# Patient Record
Sex: Female | Born: 1960
Health system: Southern US, Community
[De-identification: ages and names within clinical notes are randomized; demographics above are authoritative.]

## PROBLEM LIST (undated history)

## (undated) DIAGNOSIS — L709 Acne, unspecified: Secondary | ICD-10-CM

## (undated) DIAGNOSIS — K449 Diaphragmatic hernia without obstruction or gangrene: Secondary | ICD-10-CM

## (undated) DIAGNOSIS — D649 Anemia, unspecified: Secondary | ICD-10-CM

## (undated) DIAGNOSIS — Z9289 Personal history of other medical treatment: Secondary | ICD-10-CM

## (undated) HISTORY — DX: Anemia, unspecified: D64.9

## (undated) HISTORY — PX: APPENDECTOMY: SHX54

## (undated) HISTORY — PX: OTHER SURGICAL HISTORY: SHX169

## (undated) HISTORY — DX: Personal history of other medical treatment: Z92.89

## (undated) HISTORY — DX: Diaphragmatic hernia without obstruction or gangrene: K44.9

## (undated) HISTORY — DX: Acne, unspecified: L70.9

---
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
## 1998-03-27 ENCOUNTER — Other Ambulatory Visit: Admission: RE | Admit: 1998-03-27 | Discharge: 1998-03-27 | Payer: Self-pay | Admitting: Obstetrics and Gynecology

## 1999-12-07 ENCOUNTER — Other Ambulatory Visit: Admission: RE | Admit: 1999-12-07 | Discharge: 1999-12-07 | Payer: Self-pay | Admitting: Obstetrics and Gynecology

## 2000-08-16 ENCOUNTER — Encounter: Payer: Self-pay | Admitting: Obstetrics and Gynecology

## 2000-08-16 ENCOUNTER — Ambulatory Visit (HOSPITAL_COMMUNITY): Admission: RE | Admit: 2000-08-16 | Discharge: 2000-08-16 | Payer: Self-pay | Admitting: Obstetrics and Gynecology

## 2001-07-16 ENCOUNTER — Other Ambulatory Visit: Admission: RE | Admit: 2001-07-16 | Discharge: 2001-07-16 | Payer: Self-pay | Admitting: Obstetrics and Gynecology

## 2002-09-24 ENCOUNTER — Other Ambulatory Visit: Admission: RE | Admit: 2002-09-24 | Discharge: 2002-09-24 | Payer: Self-pay | Admitting: Obstetrics and Gynecology

## 2004-05-18 ENCOUNTER — Other Ambulatory Visit: Admission: RE | Admit: 2004-05-18 | Discharge: 2004-05-18 | Payer: Self-pay | Admitting: Obstetrics and Gynecology

## 2005-10-26 ENCOUNTER — Other Ambulatory Visit: Admission: RE | Admit: 2005-10-26 | Discharge: 2005-10-26 | Payer: Self-pay | Admitting: Obstetrics and Gynecology

## 2007-06-14 ENCOUNTER — Other Ambulatory Visit: Admission: RE | Admit: 2007-06-14 | Discharge: 2007-06-14 | Payer: Self-pay | Admitting: Obstetrics & Gynecology

## 2007-08-21 ENCOUNTER — Ambulatory Visit (HOSPITAL_BASED_OUTPATIENT_CLINIC_OR_DEPARTMENT_OTHER): Admission: RE | Admit: 2007-08-21 | Discharge: 2007-08-21 | Payer: Self-pay | Admitting: Obstetrics & Gynecology

## 2009-01-02 ENCOUNTER — Encounter: Admission: RE | Admit: 2009-01-02 | Discharge: 2009-01-02 | Payer: Self-pay | Admitting: Family Medicine

## 2009-02-17 ENCOUNTER — Other Ambulatory Visit: Admission: RE | Admit: 2009-02-17 | Discharge: 2009-02-17 | Payer: Self-pay | Admitting: Obstetrics & Gynecology

## 2010-02-21 IMAGING — RF DG ESOPHAGUS
13 of 15 series · 19 of 24 positions shown · non-contrast
Comparison: None

CLINICAL DATA: Dysphagia.

ESOPHOGRAM/BARIUM SWALLOW
TECHNIQUE: Combined double contrast and single contrast
examination performed using effervescent crystals, thick barium
liquid, and thin barium liquid. 13 mm barium tablet ingested with
water.
Fluoroscopy time:  1.7 minutes.

[Series 1: run · 1 of 3 slices shown (1 of 13)]
[im 1/3]
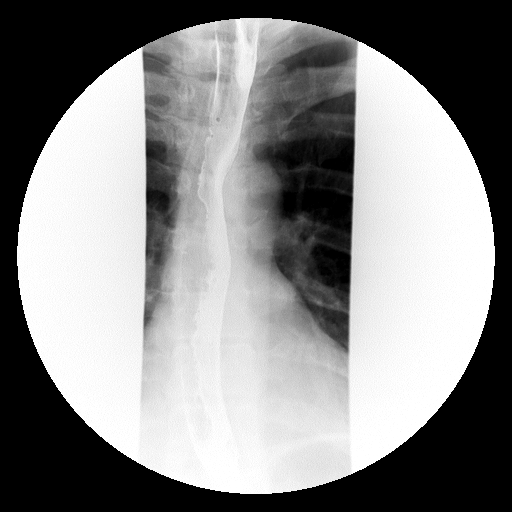

[Series 2: run · 1 of 3 slices shown (2 of 13)]
[im 1/3]
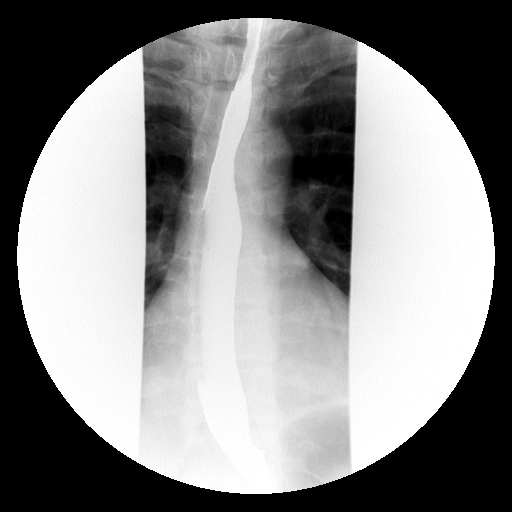

[Series 4: run · 1 of 1 slices shown (3 of 13)]
[im 1/1]
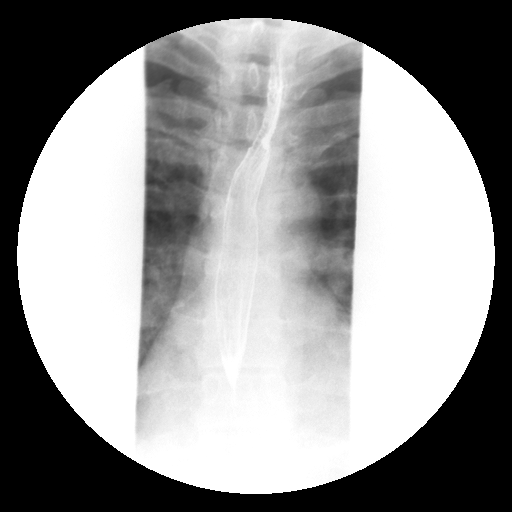

[Series 5: run · 1 of 1 slices shown (4 of 13)]
[im 1/1]
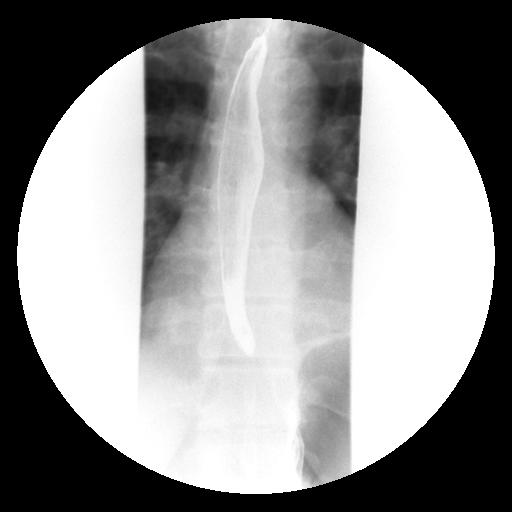

[Series 6: run · 1 of 1 slices shown (5 of 13)]
[im 1/1]
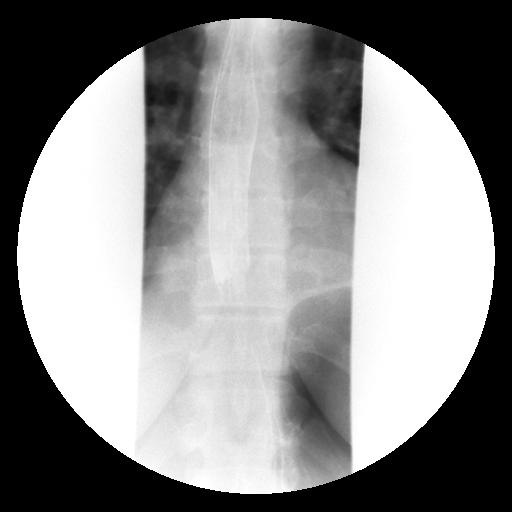

[Series 7: run · 1 of 7 slices shown (6 of 13)]
[im 1/7]
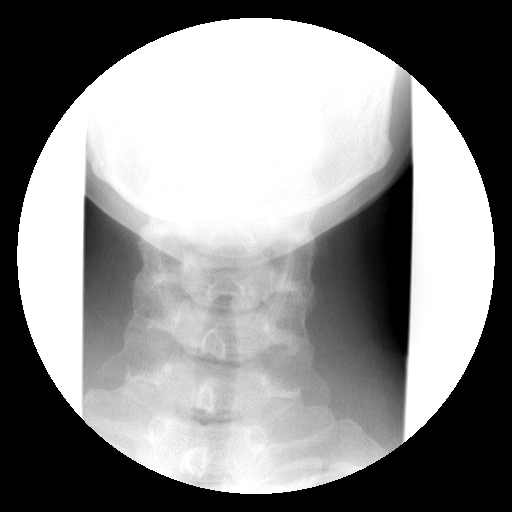

[Series 9: run · 1 of 5 slices shown (7 of 13)]
[im 1/5]
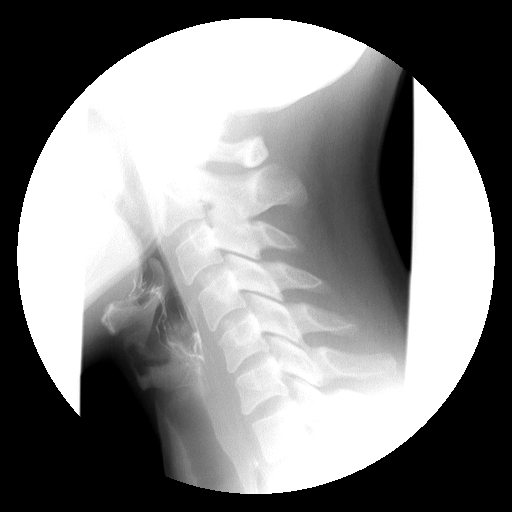

[Series 10: run · 1 of 8 slices shown (8 of 13)]
[im 1/8]
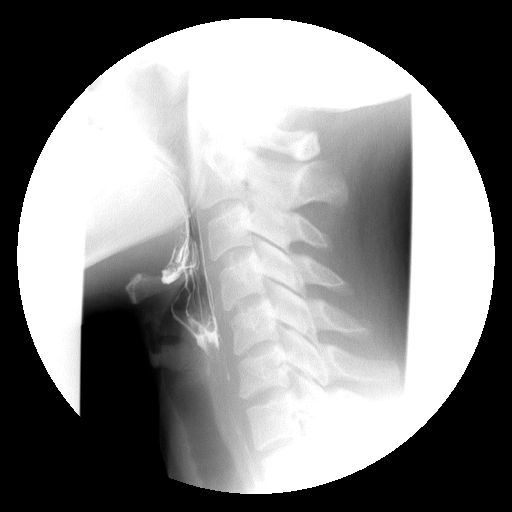

[Series 11: run · 1 of 1 slices shown (9 of 13)]
[im 1/1]
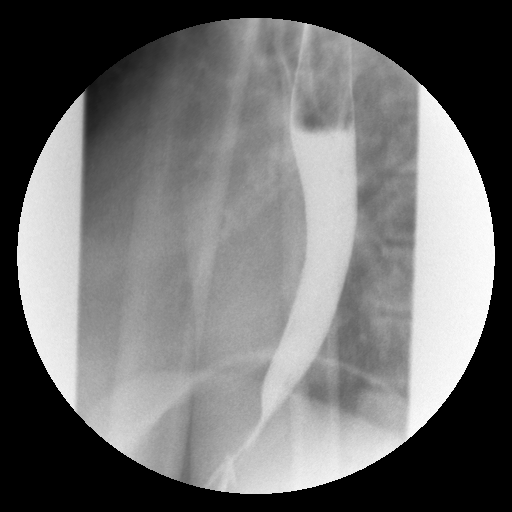

[Series 12: run · 2 of 13 slices shown (10 of 13)]
[im 7/13]
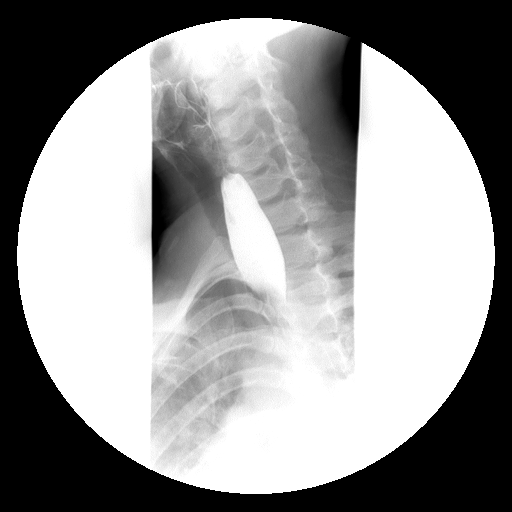
[im 13/13]
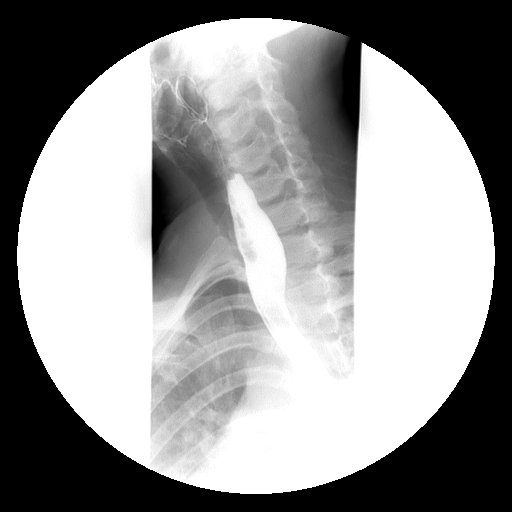

[Series 13: run · 1 of 6 slices shown (11 of 13)]
[im 1/6]
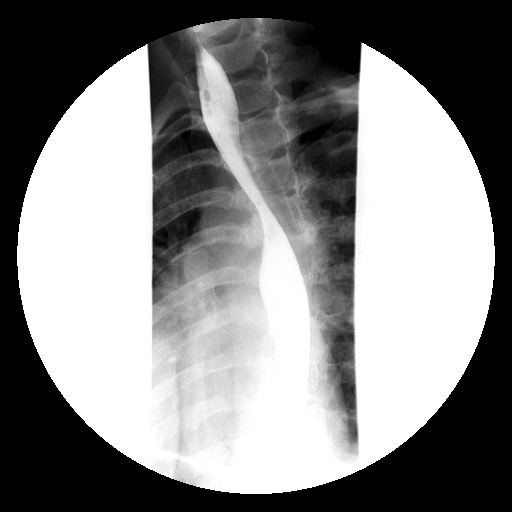

[Series 14: run · 6 of 25 slices shown (12 of 13)]
[im 1/25]
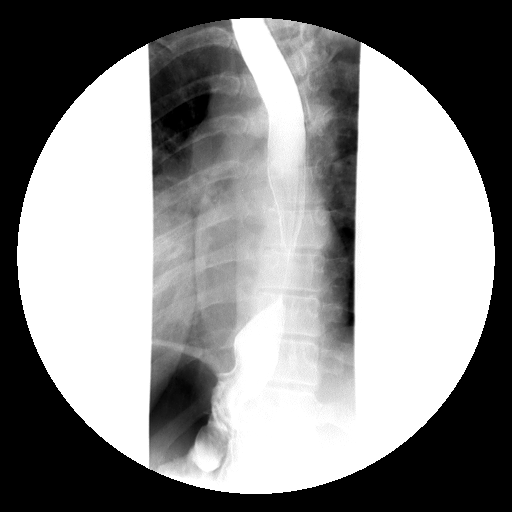
[im 7/25]
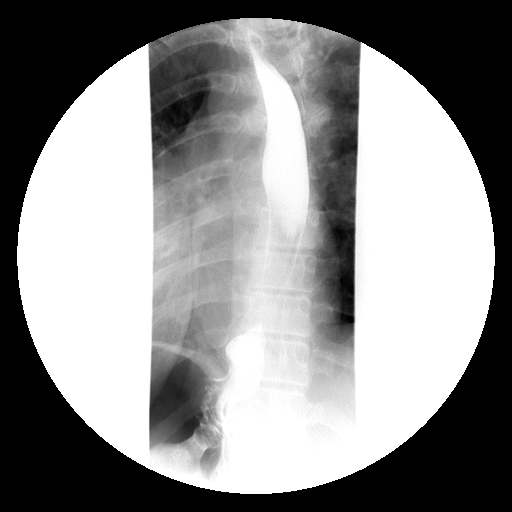
[im 11/25]
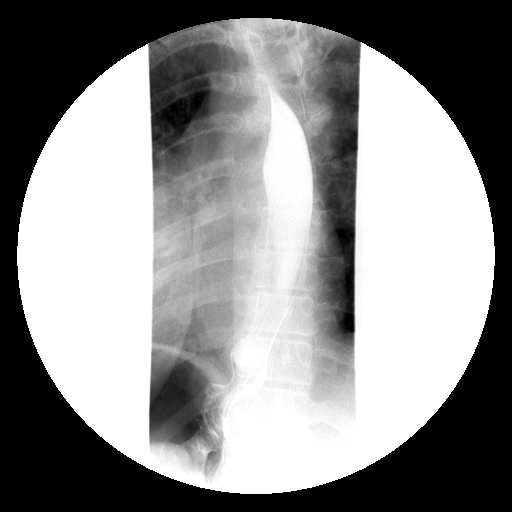
[im 14/25]
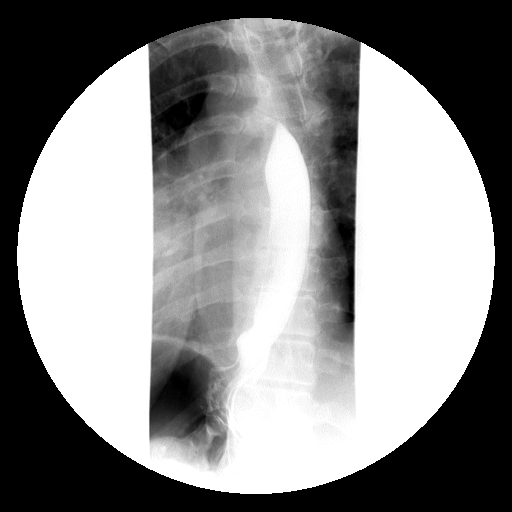
[im 18/25]
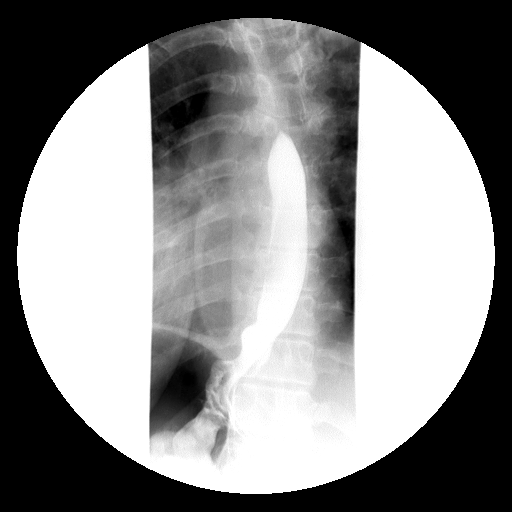
[im 25/25]
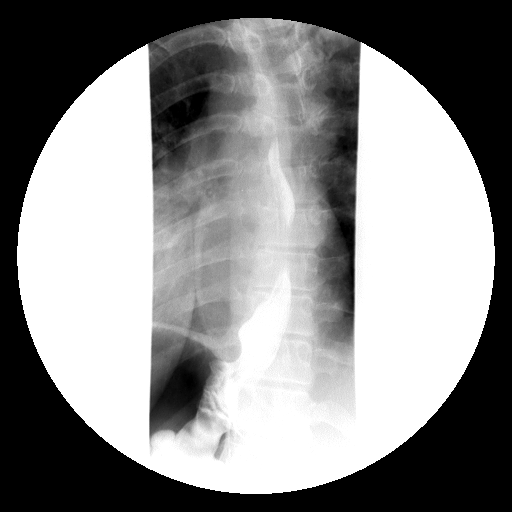

[Series 15: run · 1 of 1 slices shown (13 of 13)]
[im 1/1]
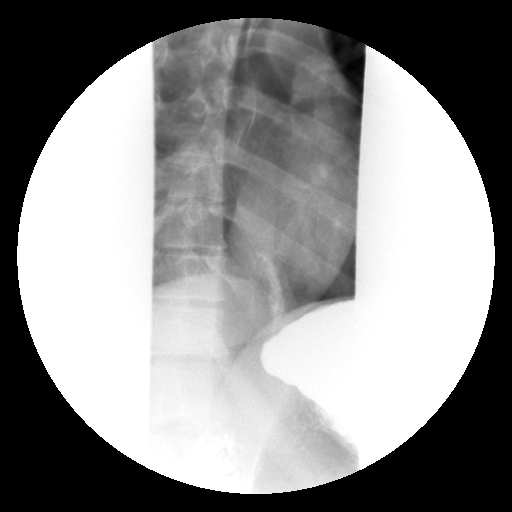

[19 of 24 positions shown; findings below may reference images not displayed]

FINDINGS: Normal antegrade peristalsis is seen through the
cervical thoracic esophagus with very small sliding type hiatus
hernia.  Ingested 13 mm barium tablet readily passed into the
stomach.  No spontaneous with slight induced (water
siphon/Valsalva) gastroesophageal reflux was demonstrated.  No
other intrinsic or extrinsic esophageal lesions seen.
IMPRESSION: 1.  Very small sliding type hiatus hernia with slight single
induced gastroesophageal reflux demonstrated.
2.  Otherwise, normal.

## 2010-04-13 ENCOUNTER — Encounter: Admission: RE | Admit: 2010-04-13 | Discharge: 2010-04-13 | Payer: Self-pay | Admitting: Obstetrics & Gynecology

## 2011-03-15 NOTE — Op Note (Signed)
NAMESYANN, Stephanie Callahan            ACCOUNT NO.:  [id number]   MEDICAL RECORD NO.:  [id number]          PATIENT TYPE:  AMB   LOCATION:  [facility]                         FACILITY:  [facility]   PHYSICIAN:  M. Leda Quail, MD  DATE OF BIRTH:  26-Nov-1960   DATE OF PROCEDURE:  08/21/2007  DATE OF DISCHARGE:                               OPERATIVE REPORT   PREOPERATIVE DIAGNOSIS:  76. 50 year old G3, P1 married white female with menorrhagia.  2. Ultrasound showed possible menorrhagia.  3. Endometrial biopsy showed benign proliferative endometrium.  4. Undesired fertility.  5. Anemia.   POSTOPERATIVE DIAGNOSIS:  40. 50 year old G58, P1 married white female with menorrhagia.  2. Ultrasound showed possible menorrhagia.  3. Endometrial biopsy showed benign proliferative endometrium.  4. Undesired fertility.  5. Anemia.   PROCEDURE:  Essure tubal occlusion and hydrothermal ablation.   SURGEON:  M. Leda Quail, M.D.   ASSISTANT:  OR staff.   ANESTHESIA:  MAC.   FINDINGS:  Thin endometrium.  Normal appearing tubal ostia bilaterally.  Photo documentation was made and is in the chart.   SPECIMENS:  None.   ESTIMATED BLOOD LOSS:  Minimal.   FLUIDS:  1000 mL of LR.   URINE OUTPUT:  40 mL.   DRAIN:  In and out catheterization at the end of the procedure.   FLUID DEFICIT:  Normal saline was used for this procedure and deficit  was minimal.   COMPLICATIONS:  None.   INDICATIONS:  Ms. Kimbler is a very nice 50 year old G3, P1 married white  female who has a history of menorrhagia.  This has worsened over the  last year and she has decided to proceed with some treatment options.  She underwent an ultrasound which showed an area of possible adenomyosis  in the uterus but, otherwise, normal findings.  She has not used birth  control in over ten years and is interested in a tubal occlusion  procedure.  She does not want a skin incision if at all possible.  Because of this, we discussed  Essure tubal occlusion.  If I was unable  to get these in the fallopian tubes, we decided not to proceed with a  laparoscopic tubal ligation.  She is comfortable with this.  She does  note that in also deciding to proceed with an ablation for treatment of  the bleeding that she should not get pregnant in the future.  She was  counseled about other options including oral contraceptives, Jearld Adjutant IUD,  and more definitive therapy with surgical excision of the uterus and she  has decided with this procedure at this time.  The risks and benefits  have been explained and are documented in the chart.  She was pretreated  with Lupron and did have a presurgical ultrasound which showed a thin  endometrium measuring approximately 3 mm.   PROCEDURE IN DETAIL:  The patient was taken to the operating room.  She  was placed in the supine position.  General anesthesia is administered  by the anesthesia staff without difficulty.  Her legs were positioned in  [place] stirrups.  Care was taken to ensure there  is no pressure on the  calves or knees.  She does have SCDs on bilaterally.  The legs were then  positioned in the dorsal lithotomy position.  The perineum, inner thighs  and vagina were prepped in a normal sterile fashion.  A red rubber Foley  catheter was used is drain the bladder of all urine.  The patient is  then draped in a normal sterile fashion.   Attention is turned the vagina.  A bivalve speculum was placed in the  vagina.  The anterior lip of the cervix was grasped with a single tooth  tenaculum.  A paracervical block is placed with 2.5 mL of 1% lidocaine  at the 3, 6, 9, and 12 o'clock positions around the cervix.  The uterus  was sounded to 10 cm.  Using [facility] dilators, the cervix is dilated to a  #19.  A 17 French hysteroscope was used for this portion of the  procedure.  This was passed through the cervical os to the fundus of the  uterus.  Tubal ostia are noted bilaterally and endometrial  tissue  appears thin.  The introducer was placed through the working channel of  the hysteroscope.  The first Essure device is passed through the working  channel and visualized at the tip of the hysteroscope.  Starting with  the left fallopian tube, the device was passed in the fallopian tube  without difficulty.  This was passed until the green portion of the  device is noted.  The wheel on the device was then rotated backwards to  a hard stop.  The gold notch and green band are noted just outside the  fallopian tube.  The button is depressed deploying the device and the  wheel is rotated backwards, again, to remove all the coil from the tube.  There were 2 1/2 coils that were outside the fallopian tube at this  point.  The first device was completely removed from the hysteroscope  and a second was placed through the working channel.  In a similar  fashion, the device is passed through the fallopian tube until the green  band was noted.  Then, the wheel was rotated backwards until a hard stop  was noted.  Then, the coil was positioned in the fallopian tube so that  the green band and the gold notch were noted.  The button was depressed  and then the device rotated backwards to free the coil from the device.  Approximately 1 loop of the coil was noted outside the tube at this  point. Photo documentation was made.   At this point, the hysteroscope was removed.  The anterior lip of the  tenaculum on the anterior lip was left in place.  The cervix was dilated  up to #21 using Pratt dilators.  Then, the hysteroscope with the [facility]  apparatus attachment was advanced through the cervical os and to the  fundus.  The device was positioned closer to the fundus because of the  length of the uterus.  At this point, when appropriate placement was  felt to be present, the heating cycle was performed.  At the end of the  heating cycle, the reservoir was at 80 87 mmHg.  There was no leaking  around the  cervix noted.  Then, the ablation cycle was performed.  The  ablation cycle was performed for ten minutes while normal saline was at  90 degrees Celsius.  There was a stable reservoir throughout the  procedure.  Once the ten minute ablation cycle was complete, a cool  cycle was performed.  At the end of the cooling cycle, photo  documentation was made.  Excellent ablation was noted and the  hysteroscope was removed.  Photo documentation is in the patient's  chart.   The tenaculum was removed from the cervix and hemostasis was achieved at  the tenaculum site with silver nitrate.  The speculum was then removed.  The patient's skin was cleaned with Betadine and she was placed back in  the supine position.  She was awakened from anesthesia and taken to the  recovery room in stable condition.  Sponge, lap and instrument counts  were correct x2.  There were no needles on the field.      Lum Keas, MD  Electronically Signed     MSM/MEDQ  D:  08/21/2007  T:  08/21/2007  Job:  [id number]

## 2011-08-08 ENCOUNTER — Encounter: Payer: Self-pay | Admitting: Cardiology

## 2011-08-08 ENCOUNTER — Ambulatory Visit (INDEPENDENT_AMBULATORY_CARE_PROVIDER_SITE_OTHER): Payer: 59 | Admitting: Cardiology

## 2011-08-08 VITALS — BP 102/68 | HR 60 | Ht 66.0 in | Wt 152.0 lb

## 2011-08-08 DIAGNOSIS — I4949 Other premature depolarization: Secondary | ICD-10-CM

## 2011-08-08 DIAGNOSIS — I493 Ventricular premature depolarization: Secondary | ICD-10-CM

## 2011-08-08 NOTE — Progress Notes (Signed)
   Stephanie Callahan Date of Birth: [birth year]   History of Present Illness: Stephanie Callahan is seen at the request of Dr. Jeannetta Nap for evaluation of PVCs. She is a pleasant 50 year old white female with no prior cardiac history. She is scheduled for minor surgery and was noted to have PVCs in a bigeminal pattern. These are completely asymptomatic. She has noted that when she walks on the treadmill and checks her heart rate it will only be 38 beats per minute. She has no dizziness or lightheadedness. She denies any palpitations. She's had no chest pain or shortness of breath. Her energy level is good. She has been on Aldactone for acne. She had recent lab work with her gynecologist including CBC, electrolytes, TSH and these were reported as normal.  No current outpatient prescriptions on file prior to visit.    No Known Allergies  Past Medical History  Diagnosis Date  . Anemia   . Acne     Past Surgical History  Procedure Date  . Uterine ablation   . Appendectomy   . Cesarean section     History  Smoking status  . Never Smoker   Smokeless tobacco  . Not on file    History  Alcohol Use  . 0.0 oz/week    2 beers on the weekend    History reviewed. No pertinent family history.  Review of Systems: As noted in history of present illness.  All other systems were reviewed and are negative.  Physical Exam: BP 102/68  Pulse 60  Ht 5\' 6"  (1.676 m)  Wt 152 lb (68.947 kg)  BMI 24.53 kg/m2 The patient is alert and oriented x 3.  The mood and affect are normal.  The skin is warm and dry.  Color is normal.  The HEENT exam reveals that the sclera are nonicteric.  The mucous membranes are moist.  The carotids are 2+ without bruits.  There is no thyromegaly.  There is no JVD.  The lungs are clear.  The chest wall is non tender.  The heart exam reveals a regular rate with a normal S1 and S2.  There are no murmurs, gallops, or rubs.  The PMI is not displaced.   Abdominal exam reveals good bowel  sounds.  There is no guarding or rebound.  There is no hepatosplenomegaly or tenderness.  There are no masses.  Exam of the legs reveal no clubbing, cyanosis, or edema.  The legs are without rashes.  The distal pulses are intact.  Cranial nerves II - XII are intact.  Motor and sensory functions are intact.  The gait is normal.  LABORATORY DATA: ECG was reviewed and demonstrates normal sinus rhythm with PVCs in a bigeminal pattern. Her ECG is otherwise normal.  Assessment / Plan:

## 2011-08-08 NOTE — Assessment & Plan Note (Signed)
She has PVCs in a bigeminal pattern. These are asymptomatic. I have recommended an echocardiogram to rule out structural heart disease. If this is normal then we can reassure her that these are benign and do not require further treatment or evaluation. She is cleared for her planned surgery.

## 2011-08-08 NOTE — Patient Instructions (Signed)
We will schedule you for an echocardiogram.  Avoid caffeine.  If your echocardiogram is ok then I would reassure you and recommend no further treatment or evaluation. In the absence of other heart disease PVCs are benign.

## 2011-08-10 ENCOUNTER — Telehealth: Payer: Self-pay | Admitting: *Deleted

## 2011-08-10 LAB — POCT PREGNANCY, URINE
Operator id: 268271
Preg Test, Ur: NEGATIVE

## 2011-08-10 LAB — CBC
Platelets: 197
RDW: 17.5 — ABNORMAL HIGH
WBC: 4.1

## 2011-08-10 LAB — URINE MICROSCOPIC-ADD ON

## 2011-08-10 LAB — URINALYSIS, ROUTINE W REFLEX MICROSCOPIC
Bilirubin Urine: NEGATIVE
Glucose, UA: NEGATIVE
Ketones, ur: NEGATIVE
Nitrite: NEGATIVE
Specific Gravity, Urine: 1.025
pH: 5

## 2011-08-10 NOTE — Telephone Encounter (Signed)
Pt calling to give name and number of office to send results to; Dr. Corrie Mckusick [phone].

## 2011-08-10 NOTE — Telephone Encounter (Signed)
Called to give her GYN physician's name and phone number. Faxed office note to Dr. Leda Quail.

## 2011-08-16 ENCOUNTER — Ambulatory Visit (HOSPITAL_COMMUNITY): Payer: 59 | Attending: Cardiology

## 2011-08-16 DIAGNOSIS — I059 Rheumatic mitral valve disease, unspecified: Secondary | ICD-10-CM | POA: Insufficient documentation

## 2011-08-16 DIAGNOSIS — I379 Nonrheumatic pulmonary valve disorder, unspecified: Secondary | ICD-10-CM | POA: Insufficient documentation

## 2011-08-16 DIAGNOSIS — I079 Rheumatic tricuspid valve disease, unspecified: Secondary | ICD-10-CM | POA: Insufficient documentation

## 2011-08-16 DIAGNOSIS — I493 Ventricular premature depolarization: Secondary | ICD-10-CM

## 2011-08-16 DIAGNOSIS — I4949 Other premature depolarization: Secondary | ICD-10-CM

## 2011-08-18 ENCOUNTER — Telehealth: Payer: Self-pay | Admitting: *Deleted

## 2011-08-18 NOTE — Telephone Encounter (Signed)
Message copied by Lorayne Bender on Thu Aug 18, 2011 11:59 AM ------      Message from: [place], PETER M      Created: Tue Aug 16, 2011  3:46 PM       Echo is normal. This indicates that her PVCs are benign. Call if she develops symptoms.            Theron Arista [place]

## 2011-08-18 NOTE — Telephone Encounter (Signed)
Lm w/ Echo results. Will send to Dr. Jeannetta Nap.

## 2011-08-23 ENCOUNTER — Telehealth: Payer: Self-pay | Admitting: Cardiology

## 2011-08-23 ENCOUNTER — Encounter: Payer: Self-pay | Admitting: *Deleted

## 2011-08-23 NOTE — Telephone Encounter (Signed)
Called back for results of Echo. Advised was nml. Wants clearance letter sent to Dr. Ronalee Belts. Faxed to [phone] w/office note and Echo results.

## 2011-08-23 NOTE — Telephone Encounter (Signed)
Pt rtn your call from Friday to get echo results

## 2011-09-14 ENCOUNTER — Encounter: Payer: Self-pay | Admitting: Cardiology

## 2011-09-29 ENCOUNTER — Encounter: Payer: Self-pay | Admitting: Cardiology

## 2012-02-29 HISTORY — PX: BREAST ENHANCEMENT SURGERY: SHX7

## 2013-08-29 ENCOUNTER — Ambulatory Visit: Payer: Self-pay | Admitting: Gynecology

## 2013-09-02 ENCOUNTER — Ambulatory Visit: Payer: Self-pay | Admitting: Obstetrics & Gynecology

## 2013-09-20 ENCOUNTER — Encounter: Payer: Self-pay | Admitting: Obstetrics & Gynecology

## 2013-10-15 ENCOUNTER — Ambulatory Visit: Payer: Self-pay | Admitting: Obstetrics & Gynecology

## 2013-11-18 ENCOUNTER — Encounter: Payer: Self-pay | Admitting: Obstetrics & Gynecology

## 2013-11-19 ENCOUNTER — Ambulatory Visit: Payer: Self-pay | Admitting: Obstetrics & Gynecology

## 2014-05-26 ENCOUNTER — Ambulatory Visit (INDEPENDENT_AMBULATORY_CARE_PROVIDER_SITE_OTHER): Payer: BC Managed Care – PPO | Admitting: Obstetrics & Gynecology

## 2014-05-26 ENCOUNTER — Encounter: Payer: Self-pay | Admitting: Obstetrics & Gynecology

## 2014-05-26 VITALS — BP 100/60 | HR 64 | Resp 16 | Ht 66.0 in | Wt 154.4 lb

## 2014-05-26 DIAGNOSIS — Z124 Encounter for screening for malignant neoplasm of cervix: Secondary | ICD-10-CM

## 2014-05-26 DIAGNOSIS — Z1211 Encounter for screening for malignant neoplasm of colon: Secondary | ICD-10-CM

## 2014-05-26 DIAGNOSIS — Z01419 Encounter for gynecological examination (general) (routine) without abnormal findings: Secondary | ICD-10-CM

## 2014-05-26 DIAGNOSIS — Z0000 Encounter for general adult medical examination without abnormal findings: Secondary | ICD-10-CM

## 2014-05-26 LAB — POCT URINALYSIS DIPSTICK
Bilirubin, UA: NEGATIVE
Glucose, UA: NEGATIVE
KETONES UA: NEGATIVE
Leukocytes, UA: NEGATIVE
Nitrite, UA: NEGATIVE
PH UA: 5
PROTEIN UA: NEGATIVE
Urobilinogen, UA: NEGATIVE

## 2014-05-26 NOTE — Progress Notes (Signed)
53 y.o. G3P1 MarriedCaucasianF here for annual exam.  Cycles have been regular until the summer.  Skipped two months.  Labs at work done yearly.  Labs were normal.  Willing to do colonoscopy this year.  Reports she occasionally has mid left sided pain.  New promotion this year with increased stress.  Occasional constipation.  Not sure if this corresponds to the pain.   Patient's last menstrual period was 02/28/2014.          Sexually active: Yes.    The current method of family planning is Essure/HTA   Exercising: Yes.    walking and occ weight lifting Smoker:  no  Health Maintenance: Pap:  08/27/12 WNL/negative HR HPV History of abnormal Pap:  no MMG:  05/26/14 3D-at [facility] per patient Colonoscopy:  none BMD:   none TDaP:  2010 Screening Labs: at work, Hb today: at work, Urine today: trace rbc   reports that she has never smoked. She has never used smokeless tobacco. She reports that she drinks about 2 - 2.5 ounces of alcohol per week. She reports that she does not use illicit drugs.  Past Medical History  Diagnosis Date  . Anemia   . Acne   . Hiatal hernia   . History of blood transfusion     Past Surgical History  Procedure Laterality Date  . Uterine ablation      Essure/HTA  . Appendectomy    . Cesarean section  1981  . Dilation and curettage of uterus  1980  . Breast enhancement surgery  5/13    Current Outpatient Prescriptions  Medication Sig Dispense Refill  . spironolactone (ALDACTONE) 50 MG tablet Take 50 mg by mouth as needed.         No current facility-administered medications for this visit.    Family History  Problem Relation Age of Onset  . Anemia Mother     ROS:  Pertinent items are noted in HPI.  Otherwise, a comprehensive ROS was negative.  Exam:   BP 100/60  Pulse 64  Resp 16  Ht 5\' 6"  (1.676 m)  Wt 154 lb 6.4 oz (70.035 kg)  BMI 24.93 kg/m2  LMP 02/28/2014   Height: 5\' 6"  (167.6 cm)  Ht Readings from Last 3 Encounters:  05/26/14 5\' 6"   (1.676 m)  08/08/11 5\' 6"  (1.676 m)    General appearance: alert, cooperative and appears stated age Head: Normocephalic, without obvious abnormality, atraumatic Neck: no adenopathy, supple, symmetrical, trachea midline and thyroid normal to inspection and palpation Lungs: clear to auscultation bilaterally Breasts: normal appearance, no masses or tenderness, bilateral implants Heart: regular rate and rhythm Abdomen: soft, non-tender; bowel sounds normal; no masses,  no organomegaly Extremities: extremities normal, atraumatic, no cyanosis or edema Skin: Skin color, texture, turgor normal. No rashes or lesions Lymph nodes: Cervical, supraclavicular, and axillary nodes normal. No abnormal inguinal nodes palpated Neurologic: Grossly normal   Pelvic: External genitalia:  no lesions              Urethra:  normal appearing urethra with no masses, tenderness or lesions              Bartholins and Skenes: normal                 Vagina: normal appearing vagina with normal color and discharge, no lesions              Cervix: no lesions  Pap taken: Yes.   Bimanual Exam:  Uterus:  normal size, contour, position, consistency, mobility, non-tender              Adnexa: normal adnexa and no mass, fullness, tenderness               Rectovaginal: Confirms               Anus:  normal sphincter tone, no lesions  A:  Well Woman with normal exam  Amenorrhea x 2 months, questionable perimenopausal.  Instructions given for pt to call if bleeding is heavy or cycle is long H/O Essure/HTA 10/08 H/O breast implants Mid left sided pain  P:   Mammogram yearly.  Pt did it today. pap smear only obtained today.  Neg pap with neg HR HPV 10/13 Labs are work.  Pt will send me copy. Referral to Dr. Loreta AveMann for colonoscopy. return annually or prn  An After Visit Summary was printed and given to the patient.

## 2014-05-29 LAB — IPS PAP TEST WITH REFLEX TO HPV

## 2014-06-06 ENCOUNTER — Telehealth: Payer: Self-pay | Admitting: Obstetrics & Gynecology

## 2014-06-06 NOTE — Telephone Encounter (Signed)
Left message for patient to call back. Need to make her aware of appt with Dr Loreta AveMann 08.20.2015 @ [phone].

## 2014-06-13 NOTE — Telephone Encounter (Signed)
Patient returning Sabrina's call. °

## 2014-06-16 NOTE — Telephone Encounter (Signed)
Spoke with patient. She is aware of appt with Dr Loreta AveMann

## 2014-09-01 ENCOUNTER — Encounter: Payer: Self-pay | Admitting: Obstetrics & Gynecology

## 2015-06-16 ENCOUNTER — Ambulatory Visit: Payer: BC Managed Care – PPO | Admitting: Obstetrics & Gynecology

## 2015-10-06 ENCOUNTER — Encounter: Payer: Self-pay | Admitting: Obstetrics & Gynecology

## 2015-10-06 ENCOUNTER — Ambulatory Visit (INDEPENDENT_AMBULATORY_CARE_PROVIDER_SITE_OTHER): Payer: BLUE CROSS/BLUE SHIELD | Admitting: Obstetrics & Gynecology

## 2015-10-06 VITALS — BP 102/76 | HR 86 | Resp 18 | Ht 66.0 in | Wt 165.0 lb

## 2015-10-06 DIAGNOSIS — Z0000 Encounter for general adult medical examination without abnormal findings: Secondary | ICD-10-CM | POA: Diagnosis not present

## 2015-10-06 DIAGNOSIS — Z1211 Encounter for screening for malignant neoplasm of colon: Secondary | ICD-10-CM | POA: Diagnosis not present

## 2015-10-06 DIAGNOSIS — Z01419 Encounter for gynecological examination (general) (routine) without abnormal findings: Secondary | ICD-10-CM | POA: Diagnosis not present

## 2015-10-06 LAB — POCT URINALYSIS DIPSTICK
Bilirubin, UA: NEGATIVE
Glucose, UA: NORMAL
Ketones, UA: NEGATIVE
Leukocytes, UA: NEGATIVE
NITRITE UA: NEGATIVE
PH UA: 5
Protein, UA: NEGATIVE
UROBILINOGEN UA: NEGATIVE

## 2015-10-06 NOTE — Progress Notes (Signed)
54 y.o. G63P1 Married Caucasian F here for annual exam.  Doing well.  No vaginal bleeding.  Having some hot flashes this year.  Denies vaginal bleeding.  Pt would like to try and have her colonoscopy this year, if possible.  Pt is pretty sure she hasn't had any bleeding this year.    PCP:  Dr. Jeannetta Nap.  Hasn't been seen this year.  Did a health screening at work.  This was ok per her report.  No LMP recorded. Patient is not currently having periods (Reason: Other).          Sexually active: Yes.    The current method of family planning is None.    Exercising:  None Smoker:  no  Health Maintenance: Pap:  Neg Pap with Neg HR HPV 10/13, neg pap 2015 History of abnormal Pap:  no MMG:  05/26/2014 BIRADS Category 1 Negative.  Pt aware this is overdue. Colonoscopy:  None BMD:   None TDaP: 2010 Screening Labs: did at work, Hb today: did at work, Urine today: Yellow / Clear   reports that she has never smoked. She has never used smokeless tobacco. She reports that she drinks about 2.0 - 2.5 oz of alcohol per week. She reports that she does not use illicit drugs.  Past Medical History  Diagnosis Date  . Anemia   . Acne   . Hiatal hernia   . History of blood transfusion     Past Surgical History  Procedure Laterality Date  . Uterine ablation      Essure/HTA  . Appendectomy    . Cesarean section  1981  . Dilation and curettage of uterus  1980  . Breast enhancement surgery  5/13    Current Outpatient Prescriptions  Medication Sig Dispense Refill  . spironolactone (ALDACTONE) 50 MG tablet Take 50 mg by mouth as needed.       No current facility-administered medications for this visit.    Family History  Problem Relation Age of Onset  . Anemia Mother     ROS:  Pertinent items are noted in HPI.  Otherwise, a comprehensive ROS was negative.  Exam:   BP 102/76 mmHg  Pulse 86  Resp 18  Ht  (1.676 m)  Wt 165 lb (74.844 kg)  BMI 26.64 kg/m2  LMP   Weight change: +11#   Height:  (167.6 cm)  Ht Readings from Last 3 Encounters:  10/06/15  (1.676 m)  05/26/14  (1.676 m)  08/08/11  (1.676 m)    General appearance: alert, cooperative and appears stated age Head: Normocephalic, without obvious abnormality, atraumatic Neck: no adenopathy, supple, symmetrical, trachea midline and thyroid normal to inspection and palpation Lungs: clear to auscultation bilaterally Breasts: normal appearance, no masses or tenderness Heart: regular rate and rhythm Abdomen: soft, non-tender; bowel sounds normal; no masses,  no organomegaly Extremities: extremities normal, atraumatic, no cyanosis or edema Skin: Skin color, texture, turgor normal. No rashes or lesions Lymph nodes: Cervical, supraclavicular, and axillary nodes normal. No abnormal inguinal nodes palpated Neurologic: Grossly normal   Pelvic: External genitalia:  no lesions              Urethra:  normal appearing urethra with no masses, tenderness or lesions              Bartholins and Skenes: normal                 Vagina: normal appearing vagina with normal color and  discharge, no lesions              Cervix: no lesions              Pap taken: No. Bimanual Exam:  Uterus:  normal size, contour, position, consistency, mobility, non-tender              Adnexa: normal adnexa and no mass, fullness, tenderness               Rectovaginal: Confirms               Anus:  normal sphincter tone, no lesions  Chaperone was present for exam.  A:  Well Woman with normal exam  Menopausal H/O Essure/HTA 10/08 H/O breast implants Referral for colonscopy  P: Mammogram yearly. pap smear done 2015. Neg pap with neg HR HPV 10/13.  No pap today. Labs were done at work this summer Referral, again, to Dr. Loreta AveMann for colonoscopy. return annually or prn

## 2015-10-06 NOTE — Progress Notes (Signed)
Schedule patient while in office for OV with Dr.Mann for evaluation to schedule colonoscopy. Patient is scheduled for 10/08/2015 at 3 pm. Agreeable to date and time.

## 2015-11-05 ENCOUNTER — Ambulatory Visit: Payer: Self-pay | Admitting: Obstetrics & Gynecology

## 2016-06-17 DIAGNOSIS — Z1211 Encounter for screening for malignant neoplasm of colon: Secondary | ICD-10-CM | POA: Diagnosis not present

## 2016-09-23 DIAGNOSIS — S0181XA Laceration without foreign body of other part of head, initial encounter: Secondary | ICD-10-CM | POA: Diagnosis not present

## 2016-09-27 ENCOUNTER — Ambulatory Visit: Payer: Self-pay | Admitting: Obstetrics & Gynecology

## 2017-01-24 ENCOUNTER — Encounter: Payer: Self-pay | Admitting: Obstetrics & Gynecology

## 2017-01-24 ENCOUNTER — Other Ambulatory Visit (HOSPITAL_COMMUNITY)
Admission: RE | Admit: 2017-01-24 | Discharge: 2017-01-24 | Disposition: A | Discharge status: Home / Self Care | Payer: BLUE CROSS/BLUE SHIELD | Source: Ambulatory Visit | Attending: Obstetrics & Gynecology | Admitting: Obstetrics & Gynecology

## 2017-01-24 ENCOUNTER — Ambulatory Visit (INDEPENDENT_AMBULATORY_CARE_PROVIDER_SITE_OTHER): Payer: BLUE CROSS/BLUE SHIELD | Admitting: Obstetrics & Gynecology

## 2017-01-24 VITALS — BP 118/70 | HR 70 | Resp 14 | Ht 66.5 in | Wt 167.0 lb

## 2017-01-24 DIAGNOSIS — Z01411 Encounter for gynecological examination (general) (routine) with abnormal findings: Secondary | ICD-10-CM | POA: Insufficient documentation

## 2017-01-24 DIAGNOSIS — Z1151 Encounter for screening for human papillomavirus (HPV): Secondary | ICD-10-CM | POA: Diagnosis not present

## 2017-01-24 DIAGNOSIS — Z124 Encounter for screening for malignant neoplasm of cervix: Secondary | ICD-10-CM | POA: Diagnosis not present

## 2017-01-24 DIAGNOSIS — Z01419 Encounter for gynecological examination (general) (routine) without abnormal findings: Secondary | ICD-10-CM

## 2017-01-24 DIAGNOSIS — R87612 Low grade squamous intraepithelial lesion on cytologic smear of cervix (LGSIL): Secondary | ICD-10-CM | POA: Diagnosis not present

## 2017-01-24 NOTE — Patient Instructions (Signed)
Please make sure to look at your lab work and see if AST and ALT (two liver enzymes) were done.

## 2017-01-24 NOTE — Progress Notes (Signed)
56 y.o. G15P1 Married Caucasian F here for annual exam.  Doing well.  Denies vaginal bleeding.  Would like Hep C testing today.  Having some hot flashes.  Feels this is manageable.  Taking herbal supplements.  Blood work with work was all normal per pt.    No LMP recorded. Patient is not currently having periods (Reason: Other).          Sexually active: Yes.    The current method of family planning is Essure.    Exercising: No.  The patient does not participate in regular exercise at present. Smoker:  no  Health Maintenance: Pap:  05/26/14 negative  History of abnormal Pap:  no MMG:  05/26/14 BIRADS 1 negative  Colonoscopy: 05/2016 with Dr. Loreta Ave normal per patient  BMD:   never TDaP:  08/2016 at Urgent Care. Pneumonia vaccine(s):  never Zostavax:   never Hep C testing: drawn today  Screening Labs: done recently at work, Hb today: same   reports that she has never smoked. She has never used smokeless tobacco. She reports that she drinks about 2.0 - 2.5 oz of alcohol per week . She reports that she does not use drugs.  Past Medical History:  Diagnosis Date  . Acne   . Anemia   . Hiatal hernia   . History of blood transfusion     Past Surgical History:  Procedure Laterality Date  . APPENDECTOMY    . BREAST ENHANCEMENT SURGERY  5/13  . CESAREAN SECTION  1981  . DILATION AND CURETTAGE OF UTERUS  1980  . uterine ablation     Essure/HTA    Current Outpatient Prescriptions  Medication Sig Dispense Refill  . BLACK COHOSH PO Take by mouth daily.    . NON FORMULARY Estro vive    . spironolactone (ALDACTONE) 50 MG tablet Take 50 mg by mouth as needed.       No current facility-administered medications for this visit.     Family History  Problem Relation Age of Onset  . Anemia Mother     ROS:  Pertinent items are noted in HPI.  Otherwise, a comprehensive ROS was negative.  Exam:   BP 118/70 (BP Location: Right Arm, Patient Position: Sitting, Cuff Size: Normal)   Pulse 70    Resp 14   Ht 5' 6.5" (1.689 m)   Wt 167 lb (75.8 kg)   BMI 26.55 kg/m   Weight change: +13#   Height: 5' 6.5" (168.9 cm)  Ht Readings from Last 3 Encounters:  01/24/17 5' 6.5" (1.689 m)  10/06/15 5\' 6"  (1.676 m)  05/26/14 5\' 6"  (1.676 m)    General appearance: alert, cooperative and appears stated age Head: Normocephalic, without obvious abnormality, atraumatic Neck: no adenopathy, supple, symmetrical, trachea midline and thyroid normal to inspection and palpation Lungs: clear to auscultation bilaterally Breasts: normal appearance, no masses or tenderness, bilateral implants Heart: regular rate and rhythm Abdomen: soft, non-tender; bowel sounds normal; no masses,  no organomegaly Extremities: extremities normal, atraumatic, no cyanosis or edema Skin: Skin color, texture, turgor normal. No rashes or lesions Lymph nodes: Cervical, supraclavicular, and axillary nodes normal. No abnormal inguinal nodes palpated Neurologic: Grossly normal   Pelvic: External genitalia:  no lesions              Urethra:  normal appearing urethra with no masses, tenderness or lesions              Bartholins and Skenes: normal  Vagina: normal appearing vagina with normal color and discharge, no lesions              Cervix: no lesions              Pap taken: Yes.   Bimanual Exam:  Uterus:  normal size, contour, position, consistency, mobility, non-tender              Adnexa: normal adnexa and no mass, fullness, tenderness               Rectovaginal: Confirms               Anus:  normal sphincter tone, no lesions  Chaperone was present for exam.  A:  Well Woman with normal exam PMP, no HRT H/o Essure/HTA 10/08 H/o breast implants  P:   Mammogram guidelines reviewed.  Will get last MMG to make sure pt is overdue.  She feels she did one last year. pap smear and HR HPV obtained today Hep C antibody obtained return annually or prn

## 2017-01-25 LAB — HEPATITIS C ANTIBODY: HCV AB: NEGATIVE

## 2017-01-27 LAB — CYTOLOGY - PAP: HPV: DETECTED — AB

## 2017-01-31 ENCOUNTER — Telehealth: Payer: Self-pay

## 2017-01-31 DIAGNOSIS — R87810 Cervical high risk human papillomavirus (HPV) DNA test positive: Secondary | ICD-10-CM

## 2017-01-31 DIAGNOSIS — R87612 Low grade squamous intraepithelial lesion on cytologic smear of cervix (LGSIL): Secondary | ICD-10-CM

## 2017-01-31 NOTE — Telephone Encounter (Signed)
Left message to call Kaitlyn at [phone]. 

## 2017-01-31 NOTE — Telephone Encounter (Signed)
-----   Message from Jerene Bears, MD sent at 01/27/2017 10:09 AM EDT ----- Please let pt know her pap smear was abnormal.  Needs colposcopy.  CC:  Irving Burton caldwell

## 2017-02-02 NOTE — Telephone Encounter (Signed)
Left message to call Kaitlyn at [phone]. 

## 2017-02-03 NOTE — Telephone Encounter (Signed)
Spoke with patient. Advised of results as seen below from Dr.Miller. Patient verbalizes understanding. Appointment for colposcopy scheduled for 02/16/2017 at 10 am with Dr.Miller. Patient is agreeable to date and time.  Instructions given. Motrin 800 mg po x , one hour before appointment with food. Make sure to eat a meal before appointment and drink plenty of fluids. Patient verbalized understanding and will call to reschedule if will be on menses or has any concerns regarding pregnancy. Patient agreeable and verbalized understanding of all instructions. Order placed for precert.  Routing to provider for final review. Patient agreeable to disposition. Will close encounter.

## 2017-02-03 NOTE — Telephone Encounter (Signed)
Returning a call to Kaitlyn. °

## 2017-02-08 ENCOUNTER — Telehealth: Payer: Self-pay | Admitting: Obstetrics & Gynecology

## 2017-02-08 NOTE — Telephone Encounter (Signed)
Called patient to review benefits for colposcopy scheduled with Dr. Hyacinth Meeker on 02-16-17. Left voicemail to call back and review.

## 2017-02-14 ENCOUNTER — Telehealth: Payer: Self-pay

## 2017-02-14 NOTE — Telephone Encounter (Signed)
Left message to call Patrice Moates at [phone].  Need to advise patient records received from [facility]. Most recent mammogram was performed on 05/26/2014. She is due for screening mammogram and will need to contact [facility] to schedule.

## 2017-02-16 ENCOUNTER — Encounter: Payer: Self-pay | Admitting: Obstetrics & Gynecology

## 2017-02-16 ENCOUNTER — Ambulatory Visit (INDEPENDENT_AMBULATORY_CARE_PROVIDER_SITE_OTHER): Payer: BLUE CROSS/BLUE SHIELD | Admitting: Obstetrics & Gynecology

## 2017-02-16 DIAGNOSIS — R87612 Low grade squamous intraepithelial lesion on cytologic smear of cervix (LGSIL): Secondary | ICD-10-CM | POA: Diagnosis not present

## 2017-02-16 DIAGNOSIS — N870 Mild cervical dysplasia: Secondary | ICD-10-CM | POA: Diagnosis not present

## 2017-02-16 DIAGNOSIS — R87810 Cervical high risk human papillomavirus (HPV) DNA test positive: Secondary | ICD-10-CM

## 2017-02-16 NOTE — Progress Notes (Signed)
56 y.o. Married G3P1 MWF here for colposcopy with possible biopsies and/or ECC due to LGSIL pap and +HR HPV Pap obtained 01/24/17.    Prior evaluation/treatment:  none.  Patient's last menstrual period was 02/28/2014.          Sexually active: Yes.    The current method of family planning is Essure.     Patient has been counseled about results and procedure.  Risks and benefits have bene reviewed including immediate and/or delayed bleeding, infection, cervical scaring from procedure, possibility of needing additional follow up as well as treatment.  rare risks of missing a lesion discussed as well.  All questions answered.  Pt ready to proceed.  BP 110/60 (BP Location: Right Arm, Patient Position: Sitting, Cuff Size: Normal)   Pulse 84   Resp 16   Wt 166 lb (75.3 kg)   LMP 02/28/2014 Comment: Essure/HTA 10/08  BMI 26.39 kg/m   Physical Exam  Constitutional: She is oriented to person, place, and time. She appears well-developed and well-nourished.  Genitourinary: Vagina normal. There is no rash, tenderness, lesion or injury on the right labia. There is no rash, tenderness, lesion or injury on the left labia.    Lymphadenopathy:       Right: No inguinal adenopathy present.       Left: No inguinal adenopathy present.  Neurological: She is alert and oriented to person, place, and time.  Psychiatric: She has a normal mood and affect.    Speculum placed.  3% acetic acid applied to cervix for >45 seconds.  Cervix visualized with both 7.5X and 15X magnification.  Green filter also used.  Lugols solution was used.  Findings:  No AWE but evidence of HPV changes around the cervix.  Biopsy:  2 and 8 o'clock but in same vial.  ECC:  was performed.  Monsel's was not needed.  Excellent hemostasis was present.  Pt tolerated procedure well and all instruments were removed.  Findings noted above on picture of cervix.  Assessment:  LGSIL pap with +HR HPV  Plan:  Pathology results will be called to  patient and follow-up planned pending results.

## 2017-02-21 ENCOUNTER — Telehealth: Payer: Self-pay

## 2017-02-21 NOTE — Telephone Encounter (Signed)
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  Endocervical cells were normal.  Ok to watch and repeat Pap and HR HPV 1 year.  Thanks.  CC:  Arnold Long

## 2017-02-21 NOTE — Telephone Encounter (Signed)
Left message to call Kaitlyn at [phone]. 

## 2017-02-22 NOTE — Telephone Encounter (Signed)
Spoke with patient. Advised of results and message as seen below from Dr.Miller. Patient is agreeable and verbalizes understanding. Next aex 01/29/2018. 08 recall placed.  Routing to provider for final review. Patient agreeable to disposition. Will close encounter.

## 2017-02-22 NOTE — Telephone Encounter (Signed)
Spoke with patient. Advised of message as seen below. Patient is agreeable and will call [facility] to schedule an appointment.  Routing to provider for final review. Patient agreeable to disposition. Will close encounter.

## 2017-03-21 DIAGNOSIS — Z1231 Encounter for screening mammogram for malignant neoplasm of breast: Secondary | ICD-10-CM | POA: Diagnosis not present

## 2017-04-20 DIAGNOSIS — L905 Scar conditions and fibrosis of skin: Secondary | ICD-10-CM | POA: Diagnosis not present

## 2017-04-20 DIAGNOSIS — L700 Acne vulgaris: Secondary | ICD-10-CM | POA: Diagnosis not present

## 2017-05-04 ENCOUNTER — Encounter: Payer: Self-pay | Admitting: Obstetrics & Gynecology

## 2018-01-29 ENCOUNTER — Ambulatory Visit (INDEPENDENT_AMBULATORY_CARE_PROVIDER_SITE_OTHER): Payer: BLUE CROSS/BLUE SHIELD | Admitting: Obstetrics & Gynecology

## 2018-01-29 ENCOUNTER — Encounter: Payer: Self-pay | Admitting: Obstetrics & Gynecology

## 2018-01-29 ENCOUNTER — Other Ambulatory Visit: Payer: Self-pay

## 2018-01-29 ENCOUNTER — Other Ambulatory Visit (HOSPITAL_COMMUNITY)
Admission: RE | Admit: 2018-01-29 | Discharge: 2018-01-29 | Disposition: A | Discharge status: Home / Self Care | Payer: BLUE CROSS/BLUE SHIELD | Source: Ambulatory Visit | Attending: Obstetrics & Gynecology | Admitting: Obstetrics & Gynecology

## 2018-01-29 VITALS — BP 120/68 | HR 64 | Resp 14 | Ht 66.0 in | Wt 169.0 lb

## 2018-01-29 DIAGNOSIS — Z124 Encounter for screening for malignant neoplasm of cervix: Secondary | ICD-10-CM

## 2018-01-29 DIAGNOSIS — N870 Mild cervical dysplasia: Secondary | ICD-10-CM

## 2018-01-29 DIAGNOSIS — Z01419 Encounter for gynecological examination (general) (routine) without abnormal findings: Secondary | ICD-10-CM | POA: Diagnosis not present

## 2018-01-29 NOTE — Progress Notes (Signed)
57 y.o. G3P1 MarriedCaucasianF here for annual exam.  Doing well.  Denies vaginal bleeding.    Patient's last menstrual period was 02/28/2014.          Sexually active: Yes.    The current method of family planning is Essure.    Exercising: No.  The patient does not participate in regular exercise at present. Smoker:  no  Health Maintenance: Pap:  01/24/17 LSIL. HR HPV:+detected   05/26/14 Neg  History of abnormal Pap:  Yes, CIN 1 MMG:  04/11/17 Right BIRADS2:benign. F/u 1 year  Colonoscopy:  06/17/16 Normal BMD:   Never TDaP:  2017 Pneumonia vaccine(s):  n/a Shingrix: No Hep C testing: 01/24/17 Neg  Screening Labs: at work.  Does every year at work.    reports that she has never smoked. She has never used smokeless tobacco. She reports that she drinks about 2.0 - 2.5 oz of alcohol per week. She reports that she does not use drugs.  Past Medical History:  Diagnosis Date  . Acne   . Anemia   . Hiatal hernia   . History of blood transfusion     Past Surgical History:  Procedure Laterality Date  . APPENDECTOMY    . BREAST ENHANCEMENT SURGERY  5/13  . CESAREAN SECTION  1981  . DILATION AND CURETTAGE OF UTERUS  1980  . uterine ablation     Essure/HTA    Current Outpatient Medications  Medication Sig Dispense Refill  . BLACK COHOSH PO Take by mouth daily.    . NON FORMULARY Estro vive    . spironolactone (ALDACTONE) 50 MG tablet Take 50 mg by mouth as needed.       No current facility-administered medications for this visit.     Family History  Problem Relation Age of Onset  . Anemia Mother     Review of Systems  All other systems reviewed and are negative.   Exam:   BP 120/68 (BP Location: Right Arm, Patient Position: Sitting, Cuff Size: Normal)   Pulse 64   Resp 14   Ht 5\' 6"  (1.676 m)   Wt 169 lb (76.7 kg)   LMP 02/28/2014 Comment: Essure/HTA 10/08  BMI 27.28 kg/m   Height: 5\' 6"  (167.6 cm)  Ht Readings from Last 3 Encounters:  01/29/18 5\' 6"  (1.676 m)   01/24/17 5' 6.5" (1.689 m)  10/06/15 5\' 6"  (1.676 m)    General appearance: alert, cooperative and appears stated age Head: Normocephalic, without obvious abnormality, atraumatic Neck: no adenopathy, supple, symmetrical, trachea midline and thyroid normal to inspection and palpation Lungs: clear to auscultation bilaterally Breasts: normal appearance, no masses or tenderness, bilateral breast implants present Heart: regular rate and rhythm Abdomen: soft, non-tender; bowel sounds normal; no masses,  no organomegaly Extremities: extremities normal, atraumatic, no cyanosis or edema Skin: Skin color, texture, turgor normal. No rashes or lesions Lymph nodes: Cervical, supraclavicular, and axillary nodes normal. No abnormal inguinal nodes palpated Neurologic: Grossly normal   Pelvic: External genitalia:  no lesions              Urethra:  normal appearing urethra with no masses, tenderness or lesions              Bartholins and Skenes: normal                 Vagina: normal appearing vagina with normal color and discharge, no lesions              Cervix: no lesions  Pap taken: Yes.   Bimanual Exam:  Uterus:  normal size, contour, position, consistency, mobility, non-tender              Adnexa: normal adnexa and no mass, fullness, tenderness               Rectovaginal: Confirms               Anus:  normal sphincter tone, no lesions  Chaperone was present for exam.  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 pap smear and HR HPV obtained today Pt will send me copies of blood work from labs done at work Return annually or prn

## 2018-01-31 LAB — CYTOLOGY - PAP
Diagnosis: NEGATIVE
HPV: NOT DETECTED

## 2018-02-09 DIAGNOSIS — L570 Actinic keratosis: Secondary | ICD-10-CM | POA: Diagnosis not present

## 2018-05-15 ENCOUNTER — Ambulatory Visit: Payer: BLUE CROSS/BLUE SHIELD | Admitting: Obstetrics & Gynecology

## 2019-04-12 ENCOUNTER — Ambulatory Visit: Payer: Self-pay | Admitting: Obstetrics & Gynecology

## 2019-07-09 ENCOUNTER — Ambulatory Visit: Payer: Self-pay | Admitting: Obstetrics & Gynecology

## 2019-07-11 NOTE — Progress Notes (Deleted)
58 y.o. G53P1 Married White or Caucasian female here for annual exam.    Patient's last menstrual period was 02/28/2014.          Sexually active: {yes no:314532}  The current method of family planning is ***Essure.    Exercising: {yes no:314532}  {types:19826} Smoker:  {YES NO:22349}  Health Maintenance: Pap:  01/29/2018 Negative  01/24/2017 LGSIL HPV Positive History of abnormal Pap:  yes MMG:  04/11/2017 BIRADS 2 Benign Density C - F/U 1 year Colonoscopy:  06/17/2017 Normal BMD:   *** TDaP:  2017 Pneumonia vaccine(s):  *** Shingrix:   *** Hep C testing: 01/24/2017 Negative Screening Labs: Does at work.   reports that she has never smoked. She has never used smokeless tobacco. She reports current alcohol use of about 4.0 - 5.0 standard drinks of alcohol per week. She reports that she does not use drugs.  Past Medical History:  Diagnosis Date  . Acne   . Anemia   . Hiatal hernia   . History of blood transfusion     Past Surgical History:  Procedure Laterality Date  . APPENDECTOMY    . BREAST ENHANCEMENT SURGERY  5/13  . CESAREAN SECTION  1981  . DILATION AND CURETTAGE OF UTERUS  1980  . uterine ablation     Essure/HTA    Current Outpatient Medications  Medication Sig Dispense Refill  . BLACK COHOSH PO Take by mouth daily.    . NON FORMULARY Estro vive    . spironolactone (ALDACTONE) 50 MG tablet Take 50 mg by mouth as needed.       No current facility-administered medications for this visit.     Family History  Problem Relation Age of Onset  . Anemia Mother     Review of Systems  Exam:   LMP 02/28/2014 Comment: Essure/HTA 10/08  Height:      Ht Readings from Last 3 Encounters:  01/29/18 5\' 6"  (1.676 m)  01/24/17 5' 6.5" (1.689 m)  10/06/15 5\' 6"  (1.676 m)    General appearance: alert, cooperative and appears stated age Head: Normocephalic, without obvious abnormality, atraumatic Neck: no adenopathy, supple, symmetrical, trachea midline and thyroid {EXAM;  THYROID:18604} Lungs: clear to auscultation bilaterally Breasts: {Exam; breast:13139::"normal appearance, no masses or tenderness"} Heart: regular rate and rhythm Abdomen: soft, non-tender; bowel sounds normal; no masses,  no organomegaly Extremities: extremities normal, atraumatic, no cyanosis or edema Skin: Skin color, texture, turgor normal. No rashes or lesions Lymph nodes: Cervical, supraclavicular, and axillary nodes normal. No abnormal inguinal nodes palpated Neurologic: Grossly normal   Pelvic: External genitalia:  no lesions              Urethra:  normal appearing urethra with no masses, tenderness or lesions              Bartholins and Skenes: normal                 Vagina: normal appearing vagina with normal color and discharge, no lesions              Cervix: {exam; cervix:14595}              Pap taken: {yes no:314532} Bimanual Exam:  Uterus:  {exam; uterus:12215}              Adnexa: {exam; adnexa:12223}               Rectovaginal: Confirms               Anus:  normal sphincter tone, no lesions  Chaperone was present for exam.  A:  Well Woman with normal exam  P:   {plan; gyn:5269::"mammogram","pap smear","return annually or prn"}

## 2019-07-15 ENCOUNTER — Other Ambulatory Visit: Payer: Self-pay

## 2019-07-15 ENCOUNTER — Ambulatory Visit: Payer: Self-pay | Admitting: Obstetrics & Gynecology

## 2019-08-27 ENCOUNTER — Other Ambulatory Visit: Payer: Self-pay

## 2019-08-29 ENCOUNTER — Ambulatory Visit (INDEPENDENT_AMBULATORY_CARE_PROVIDER_SITE_OTHER): Payer: BC Managed Care – PPO | Admitting: Obstetrics & Gynecology

## 2019-08-29 ENCOUNTER — Encounter: Payer: Self-pay | Admitting: Obstetrics & Gynecology

## 2019-08-29 ENCOUNTER — Other Ambulatory Visit: Payer: Self-pay

## 2019-08-29 VITALS — BP 126/68 | HR 72 | Temp 97.2°F | Resp 12 | Ht 65.75 in | Wt 170.0 lb

## 2019-08-29 DIAGNOSIS — Z01419 Encounter for gynecological examination (general) (routine) without abnormal findings: Secondary | ICD-10-CM | POA: Diagnosis not present

## 2019-08-29 DIAGNOSIS — Z0184 Encounter for antibody response examination: Secondary | ICD-10-CM

## 2019-08-29 NOTE — Progress Notes (Signed)
58 y.o. G24P1 Married White or Caucasian female here for annual exam.  Doing well.  Working remotely.  She's ready to be back in the office.    Denies vaginal bleeding.    PCP:  Dr. Jeannetta Nap.  Last blood work was done in 2019.    Patient's last menstrual period was 02/28/2014.          Sexually active: Yes.    The current method of family planning is Essure.    Exercising: No.  The patient does not participate in regular exercise at present. Smoker:  no  Health Maintenance: Pap:  01/29/18 Neg. HR HPV not detected  01/24/17 LSIL. HR HPV:+detected              05/26/14 Neg  History of abnormal Pap:  Yes, CIN 1 MMG:  04/11/17 Right BIRADS2:benign.  She is aware this overdue.   Colonoscopy:  06/17/16 Normal BMD:   never TDaP:  2017 Pneumonia vaccine(s):  never Shingrix:   never Hep C testing: 01/24/17 Negative Screening Labs: Labs done at work   reports that she has never smoked. She has never used smokeless tobacco. She reports current alcohol use of about 4.0 - 5.0 standard drinks of alcohol per week. She reports that she does not use drugs.  Past Medical History:  Diagnosis Date  . Acne   . Anemia   . Hiatal hernia   . History of blood transfusion     Past Surgical History:  Procedure Laterality Date  . APPENDECTOMY    . BREAST ENHANCEMENT SURGERY  5/13  . CESAREAN SECTION  1981  . DILATION AND CURETTAGE OF UTERUS  1980  . uterine ablation     Essure/HTA    Current Outpatient Medications  Medication Sig Dispense Refill  . BLACK COHOSH PO Take by mouth daily.    . NON FORMULARY Estro vive    . spironolactone (ALDACTONE) 50 MG tablet Take 50 mg by mouth as needed.       No current facility-administered medications for this visit.     Family History  Problem Relation Age of Onset  . Anemia Mother     Review of Systems  Constitutional: Negative.   HENT: Negative.   Eyes: Negative.   Respiratory: Negative.   Cardiovascular: Negative.   Gastrointestinal: Negative.    Endocrine: Negative.   Genitourinary: Negative.   Musculoskeletal: Negative.   Skin: Negative.   Allergic/Immunologic: Negative.   Neurological: Negative.   Hematological: Negative.   Psychiatric/Behavioral: Negative.     Exam:   BP 126/68 (BP Location: Left Arm, Patient Position: Sitting, Cuff Size: Normal)   Pulse 72   Temp (!) 97.2 F (36.2 C) (Temporal)   Resp 12   Ht 5' 5.75" (1.67 m)   Wt 170 lb (77.1 kg)   LMP 02/28/2014 Comment: Essure/HTA 10/08  BMI 27.65 kg/m    Height: 5' 5.75" (167 cm)  Ht Readings from Last 3 Encounters:  08/29/19 5' 5.75" (1.67 m)  01/29/18 5\' 6"  (1.676 m)  01/24/17 5' 6.5" (1.689 m)    General appearance: alert, cooperative and appears stated age Head: Normocephalic, without obvious abnormality, atraumatic Neck: no adenopathy, supple, symmetrical, trachea midline and thyroid normal to inspection and palpation Lungs: clear to auscultation bilaterally Breasts: normal appearance, no masses or tenderness, bilateral implants Heart: regular rate and rhythm Abdomen: soft, non-tender; bowel sounds normal; no masses,  no organomegaly Extremities: extremities normal, atraumatic, no cyanosis or edema Skin: Skin color, texture, turgor normal. No rashes  or lesions Lymph nodes: Cervical, supraclavicular, and axillary nodes normal. No abnormal inguinal nodes palpated Neurologic: Grossly normal   Pelvic: External genitalia:  no lesions              Urethra:  normal appearing urethra with no masses, tenderness or lesions              Bartholins and Skenes: normal                 Vagina: normal appearing vagina with normal color and discharge, no lesions              Cervix: no lesions              Pap taken: No. Bimanual Exam:  Uterus:  normal size, contour, position, consistency, mobility, non-tender              Adnexa: normal adnexa and no mass, fullness, tenderness               Rectovaginal: Confirms               Anus:  normal sphincter tone,  no lesions  Chaperone was present for exam.  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  Current risk for CIN 3 is 0.42%.  Pap and HR HPV should be repeated in 3 years. Bilateral implants H/o uterine ablation/essure placement  P:   Mammogram guidelines reviewed. She is aware this is overdue. HR HPV/pap will be due in two year.   Varicella antibodies obtained today as she would desire having chicken pox vaccination if does not have antibodies No blood work obtained today.  Pt declines. Colonoscopy is UTD Plan BMD between 60 and 65 Return annually or prn

## 2019-08-29 NOTE — Patient Instructions (Signed)
Zoster Vaccine, Recombinant injection What is this medicine? ZOSTER VACCINE (ZOS ter vak SEEN) is used to prevent shingles in adults 57 years old and over. This vaccine is not used to treat shingles or nerve pain from shingles. This medicine may be used for other purposes; ask your health care provider or pharmacist if you have questions. COMMON BRAND NAME(S): SHINGRIX What should I tell my health care provider before I take this medicine? They need to know if you have any of these conditions:  blood disorders or disease  cancer like leukemia or lymphoma  immune system problems or therapy  an unusual or allergic reaction to vaccines, other medications, foods, dyes, or preservatives  pregnant or trying to get pregnant  breast-feeding How should I use this medicine? This vaccine is for injection in a muscle. It is given by a health care professional. Talk to your pediatrician regarding the use of this medicine in children. This medicine is not approved for use in children. Overdosage: If you think you have taken too much of this medicine contact a poison control center or emergency room at once. NOTE: This medicine is only for you. Do not share this medicine with others. What if I miss a dose? Keep appointments for follow-up (booster) doses as directed. It is important not to miss your dose. Call your doctor or health care professional if you are unable to keep an appointment. What may interact with this medicine?  medicines that suppress your immune system  medicines to treat cancer  steroid medicines like prednisone or cortisone This list may not describe all possible interactions. Give your health care provider a list of all the medicines, herbs, non-prescription drugs, or dietary supplements you use. Also tell them if you smoke, drink alcohol, or use illegal drugs. Some items may interact with your medicine. What should I watch for while using this medicine? Visit your doctor for  regular check ups. This vaccine, like all vaccines, may not fully protect everyone. What side effects may I notice from receiving this medicine? Side effects that you should report to your doctor or health care professional as soon as possible:  allergic reactions like skin rash, itching or hives, swelling of the face, lips, or tongue  breathing problems Side effects that usually do not require medical attention (report these to your doctor or health care professional if they continue or are bothersome):  chills  headache  fever  nausea, vomiting  redness, warmth, pain, swelling or itching at site where injected  tiredness This list may not describe all possible side effects. Call your doctor for medical advice about side effects. You may report side effects to FDA at 1-800-FDA-1088. Where should I keep my medicine? This vaccine is only given in a clinic, pharmacy, doctor's office, or other health care setting and will not be stored at home. NOTE: This sheet is a summary. It may not cover all possible information. If you have questions about this medicine, talk to your doctor, pharmacist, or health care provider.  2020 Elsevier/Gold Standard (2017-05-29 13:20:30)  

## 2019-08-30 LAB — VARICELLA ZOSTER ANTIBODY, IGG: Varicella zoster IgG: 633 index (ref >165)

## 2020-09-11 NOTE — Progress Notes (Signed)
59 y.o. G12P1 Married White or Caucasian female here for annual exam.     Denies problems today Usually gets blood work through work but has not been to work in 2 years (working from home), wants blood test today. No symptoms, just for screening purposes.  Patient's last menstrual period was 02/28/2014.          Sexually active: Yes.    The current method of family planning is Essure. Exercising: No.  exercise Smoker:  no  Health Maintenance: Pap:  01-24-17 LGSIL HPV HR+, 01-29-18 neg HPV HR neg History of abnormal Pap:  Yes 2018 MMG: 02/2017 bilateral & 04-11-17 rt breast category c density birads 2:neg Colonoscopy:  06-17-16 normal BMD:   none TDaP:  2017 Gardasil:   n/a Covid-19: not done Pneumonia vaccine(s): not done Shingrix:   not done Hep C testing: neg 2018 Screening Labs: today   reports that she has never smoked. She has never used smokeless tobacco. She reports current alcohol use of about 4.0 - 5.0 standard drinks of alcohol per week. She reports that she does not use drugs.  Past Medical History:  Diagnosis Date  . Acne   . Anemia   . Hiatal hernia   . History of blood transfusion    neg HIV and Hepatitis testing has been done    Past Surgical History:  Procedure Laterality Date  . APPENDECTOMY    . BREAST ENHANCEMENT SURGERY  5/13  . CESAREAN SECTION  1981  . DILATION AND CURETTAGE OF UTERUS  1980  . uterine ablation     Essure/HTA    Current Outpatient Medications  Medication Sig Dispense Refill  . Ascorbic Acid (VITAMIN C PO) Take by mouth.    . COD LIVER OIL PO Take by mouth.    . Multiple Vitamins-Minerals (ZINC PO) Take by mouth.    . spironolactone (ALDACTONE) 50 MG tablet Take 50 mg by mouth as needed.      Marland Kitchen UNABLE TO FIND Mental alertness vitamin    . VITAMIN D PO Take by mouth.     No current facility-administered medications for this visit.    Family History  Problem Relation Age of Onset  . Anemia Mother     Review of Systems   Constitutional: Negative.   HENT: Negative.   Eyes: Negative.   Respiratory: Negative.   Cardiovascular: Negative.   Gastrointestinal: Negative.   Endocrine: Negative.   Genitourinary: Negative.   Musculoskeletal: Negative.   Skin: Negative.   Allergic/Immunologic: Negative.   Neurological: Negative.   Hematological: Negative.   Psychiatric/Behavioral: Negative.     Exam:   BP 110/68   Pulse 68   Resp 16   Ht 5' 5.75" (1.67 m)   Wt 167 lb (75.8 kg)   LMP 02/28/2014 Comment: Essure/HTA 10/08  BMI 27.16 kg/m   Height: 5' 5.75" (167 cm)  General appearance: alert, cooperative and appears stated age Head: Normocephalic, without obvious abnormality, atraumatic Neck: no adenopathy, supple, symmetrical, trachea midline and thyroid normal to inspection and palpation Lungs: clear to auscultation bilaterally Breasts: normal appearance, no masses or tenderness, implants Heart: regular rate and rhythm Abdomen: soft, non-tender; bowel sounds normal; no masses,  no organomegaly Extremities: extremities normal, atraumatic, no cyanosis or edema Skin: Skin color, texture, turgor normal. No rashes or lesions Lymph nodes: Cervical, supraclavicular, and axillary nodes normal. No abnormal inguinal nodes palpated Neurologic: Grossly normal   Pelvic: External genitalia:  no lesions  Urethra:  normal appearing urethra with no masses, tenderness or lesions              Bartholins and Skenes: normal                 Vagina: normal appearing vagina with normal color and discharge, no lesions              Cervix: no cervical motion tenderness and no lesions              Pap taken: No. Bimanual Exam:  Uterus:  normal size, contour, position, consistency, mobility, non-tender              Adnexa: no mass, fullness, tenderness               Rectovaginal: Confirms               Anus:  normal sphincter tone, no lesions  Shannon CMA Chaperone was present for exam.  A:  Well Woman with  normal exam  P:   Mammogram, over due, states will get prior to coming back next year pap smear due in 2022 (co-testing) return annually or prn

## 2020-09-14 ENCOUNTER — Encounter: Payer: Self-pay | Admitting: Nurse Practitioner

## 2020-09-14 ENCOUNTER — Other Ambulatory Visit: Payer: Self-pay

## 2020-09-14 ENCOUNTER — Ambulatory Visit: Payer: Self-pay | Admitting: Obstetrics & Gynecology

## 2020-09-14 ENCOUNTER — Ambulatory Visit (INDEPENDENT_AMBULATORY_CARE_PROVIDER_SITE_OTHER): Payer: BC Managed Care – PPO | Admitting: Nurse Practitioner

## 2020-09-14 VITALS — BP 110/68 | HR 68 | Resp 16 | Ht 65.75 in | Wt 167.0 lb

## 2020-09-14 DIAGNOSIS — Z01419 Encounter for gynecological examination (general) (routine) without abnormal findings: Secondary | ICD-10-CM | POA: Diagnosis not present

## 2020-09-15 LAB — COMPREHENSIVE METABOLIC PANEL
ALT: 20 IU/L (ref 0–32)
AST: 19 IU/L (ref 0–40)
Albumin/Globulin Ratio: 2.1 (ref 1.2–2.2)
Albumin: 4.6 g/dL (ref 3.8–4.9)
Alkaline Phosphatase: 61 IU/L (ref 44–121)
BUN/Creatinine Ratio: 20 (ref 9–23)
BUN: 20 mg/dL (ref 6–24)
Bilirubin Total: 0.3 mg/dL (ref 0.0–1.2)
CO2: 26 mmol/L (ref 20–29)
Calcium: 9.5 mg/dL (ref 8.7–10.2)
Chloride: 104 mmol/L (ref 96–106)
Creatinine, Ser: 0.98 mg/dL (ref 0.57–1.00)
GFR calc Af Amer: 74 mL/min/{1.73_m2} (ref >59)
GFR calc non Af Amer: 64 mL/min/{1.73_m2} (ref >59)
Globulin, Total: 2.2 g/dL (ref 1.5–4.5)
Glucose: 103 mg/dL — ABNORMAL HIGH (ref 65–99)
Potassium: 4 mmol/L (ref 3.5–5.2)
Sodium: 144 mmol/L (ref 134–144)
Total Protein: 6.8 g/dL (ref 6.0–8.5)

## 2020-09-15 LAB — LIPID PANEL WITH LDL/HDL RATIO
Cholesterol, Total: 185 mg/dL (ref 100–199)
HDL: 49 mg/dL (ref >39)
LDL Chol Calc (NIH): 112 mg/dL — ABNORMAL HIGH (ref 0–99)
LDL/HDL Ratio: 2.3 ratio (ref 0.0–3.2)
Triglycerides: 136 mg/dL (ref 0–149)
VLDL Cholesterol Cal: 24 mg/dL (ref 5–40)

## 2020-09-15 LAB — CBC
Hematocrit: 38.6 % (ref 34.0–46.6)
Hemoglobin: 13.1 g/dL (ref 11.1–15.9)
MCH: 29.4 pg (ref 26.6–33.0)
MCHC: 33.9 g/dL (ref 31.5–35.7)
MCV: 87 fL (ref 79–97)
Platelets: 244 10*3/uL (ref 150–450)
RBC: 4.45 x10E6/uL (ref 3.77–5.28)
RDW: 13.1 % (ref 11.7–15.4)
WBC: 4.6 10*3/uL (ref 3.4–10.8)

## 2021-01-14 ENCOUNTER — Ambulatory Visit: Payer: BC Managed Care – PPO | Admitting: Obstetrics & Gynecology

## 2021-02-24 DIAGNOSIS — Z713 Dietary counseling and surveillance: Secondary | ICD-10-CM | POA: Diagnosis not present

## 2021-05-12 DIAGNOSIS — Z1231 Encounter for screening mammogram for malignant neoplasm of breast: Secondary | ICD-10-CM | POA: Diagnosis not present

## 2021-05-14 ENCOUNTER — Encounter (HOSPITAL_BASED_OUTPATIENT_CLINIC_OR_DEPARTMENT_OTHER): Payer: Self-pay | Admitting: Obstetrics & Gynecology

## 2021-06-24 DIAGNOSIS — L570 Actinic keratosis: Secondary | ICD-10-CM | POA: Diagnosis not present

## 2021-08-24 DIAGNOSIS — L570 Actinic keratosis: Secondary | ICD-10-CM | POA: Diagnosis not present

## 2021-09-21 ENCOUNTER — Ambulatory Visit (HOSPITAL_BASED_OUTPATIENT_CLINIC_OR_DEPARTMENT_OTHER): Payer: BLUE CROSS/BLUE SHIELD | Admitting: Obstetrics & Gynecology

## 2021-09-22 ENCOUNTER — Ambulatory Visit (HOSPITAL_BASED_OUTPATIENT_CLINIC_OR_DEPARTMENT_OTHER): Payer: BLUE CROSS/BLUE SHIELD | Admitting: Obstetrics & Gynecology

## 2022-11-30 ENCOUNTER — Encounter (HOSPITAL_BASED_OUTPATIENT_CLINIC_OR_DEPARTMENT_OTHER): Payer: Self-pay | Admitting: Obstetrics & Gynecology

## 2024-10-15 ENCOUNTER — Encounter (INDEPENDENT_AMBULATORY_CARE_PROVIDER_SITE_OTHER): Payer: Self-pay

## 2024-10-18 ENCOUNTER — Other Ambulatory Visit: Payer: Self-pay | Admitting: Medical Genetics

## 2024-12-10 ENCOUNTER — Other Ambulatory Visit: Payer: Self-pay
